# Patient Record
Sex: Male | Born: 1985 | Hispanic: Yes | Marital: Married | Smoking: Never smoker
Health system: Southern US, Community
[De-identification: ages and names within clinical notes are randomized; demographics above are authoritative.]

---

## 2013-07-01 ENCOUNTER — Ambulatory Visit: Payer: Self-pay | Attending: Internal Medicine

## 2013-07-22 ENCOUNTER — Ambulatory Visit: Payer: No Typology Code available for payment source | Attending: Family Medicine | Admitting: Family Medicine

## 2013-07-22 ENCOUNTER — Encounter: Payer: Self-pay | Admitting: Family Medicine

## 2013-07-22 ENCOUNTER — Ambulatory Visit: Payer: No Typology Code available for payment source | Attending: Internal Medicine

## 2013-07-22 VITALS — BP 143/89 | HR 74 | Temp 98.3°F | Resp 16 | Ht 64.0 in | Wt 148.0 lb

## 2013-07-22 DIAGNOSIS — N509 Disorder of male genital organs, unspecified: Secondary | ICD-10-CM

## 2013-07-22 DIAGNOSIS — N50812 Left testicular pain: Secondary | ICD-10-CM | POA: Insufficient documentation

## 2013-07-22 DIAGNOSIS — I10 Essential (primary) hypertension: Secondary | ICD-10-CM | POA: Insufficient documentation

## 2013-07-22 LAB — CMP AND LIVER
ALT: 29 U/L (ref 0–53)
AST: 23 U/L (ref 0–37)
Alkaline Phosphatase: 98 U/L (ref 39–117)
BUN: 16 mg/dL (ref 6–23)
Creat: 1.06 mg/dL (ref 0.50–1.35)
Total Bilirubin: 0.5 mg/dL (ref 0.3–1.2)

## 2013-07-22 MED ORDER — HYDROCHLOROTHIAZIDE 12.5 MG PO TABS: 12.5000 mg | ORAL_TABLET | Freq: Every day | ORAL | Status: AC

## 2013-07-22 NOTE — Patient Instructions (Signed)
Hipertensión  (Hypertension)  Cuando el corazón late bombea la sangre a través de las arterias. La fuerza que se origina es la presión arterial. Si la presión es demasiado elevada, se denomina hipertensión. El peligro radica en que puede sufrirla y no saberlo. Hipertensión puede significar que su corazón debe trabajar más intensamente para bombear sangre. Las arterias pueden estar estrechas o rígidas. El trabajo extra aumenta el riesgo de enfermedades cardíacas, ictus y otros problemas.   La presión arterial está formada por dos números: el número mayor sobre el número menor, por ejemplo110/70. Se señala "110/72". Los valores ideales son por debajo de 120 para el número más alto (sistólica) y por debajo de 80 para el más bajo (diastólica). Anote su presión sanguínea hoy.  Debe prestar mucha atención a su presión arterial si sufre alguna otra enfermedad como:  · Insuficiencia cardíaca  · Ataques cardiacos previos  · Diabetes  · Enfermedad renal crónica  · Ictus previo  · Múltiples factores de riesgo para enfermedades cardíacas  Para diagnosticar si usted sufre hipertensión arterial, debe medirse la presión mientras encuentra sentado con el brazo elevado a la altura del nivel del corazón. Debe medirse al menos 2 veces. Una única lectura de presión arterial elevada (especialmente en el servicio de emergencias) no significa que necesita tratamiento. Hay enfermedades en las que la presión arterial es diferente en ambos brazos. Es importante que consulte rápidamente con su médico para un control.  La mayoría de las personas sufren hipertensión esencial, lo que significa que no tiene una causa específica. Este tipo de hipertensión puede bajarse modificando algunos factores en el estilo de vida como:  · Estrés.  · El consumo de cigarrillos.  · La falta de actividad física.  · Peso excesivo  · Consumo de drogas y alcohol.  · Consumiendo menos sal  La mayoría de las personas no tienen síntomas hasta que la hipertensión  ocasiona un daño en el organismo. El tratamiento efectivo puede evitar, demorar o reducir ese daño.  TRATAMIENTO:  El tratamiento para la hipertensión, cuando se ha identificado una causa, está dirigido a la misma Hay un gran número en medicamentos para tratarla. Se agrupan en diferentes categorías y el médico seleccionará los medicamentos indicados para usted. Muchos medicamentos disponibles tienen efectos secundarios. Debe comentar los efectos secundarios con su médico.  Si la presión arterial permanece elevada después de modificar su estilo de vida o comenzar a tomar medicamentos:  · Los medicamentos deben ser reemplazados  · Puede ser necesario evaluar otros problemas.  · Debe estar seguro que comprende las indicaciones, que sabe cómo y cuándo tomar los medicamentos.  · Asegúrese de realizar un control con su médico dentro del tiempo indicado (generalmente dentro de las dos semanas) para volver a evaluar la presión arterial y revisar los medicamentos prescritos.  · Si está tomando más de un medicamento para la presión arterial, asegúrese que sabe cómo y en qué momentos debe tomarlos. Tomar los medicamentos al mismo tiempo puede dar como resultado un gran descenso en la presión arterial.  SOLICITE ATENCIÓN MÉDICA DE INMEDIATO SI PRESENTA:  · Dolor de cabeza intenso, visión borrosa o cambios en la visión, o confusión.  · Debilidad o adormecimientos inusuales o sensación de desmayo.  · Dolor de pecho o abdominal intenso, vómitos o problemas para respirar.  ASEGÚRESE QUE:   · Comprende estas instrucciones.  · Controlará su enfermedad.  · Solicitará ayuda inmediatamente si no mejora o si empeora.  Document Released: 10/24/2005 Document Revised: 01/16/2012  ExitCare®   Patient Information ©2014 [employer].

## 2013-07-22 NOTE — Progress Notes (Signed)
Pt here to establish care  c/o right scrotal pain without swelling x 1 yr. Pt was seen for sx  With neg report Pain worsens with underwear. No medical hx noted

## 2013-07-22 NOTE — Progress Notes (Signed)
Patient ID: Larry Schmidt, male   DOB: July 02, 1986, 27 y.o.   MRN: [id number] Patient Demographics  Larry Schmidt, is a 27 y.o. male  WUJ:[id number]  [id number]  DOB - 1985/12/08  CC:  Chief Complaint  Patient presents with  . Testicle Pain       HPI: Larry Schmidt is a 27 y.o. male here today to establish medical care. He complains of his left testicular pain that has been going on for about 18 months, on and off, occasional swelling but no lump. Usually aggravated by standing for a long time, no known relieving factor. No fever. No history of sexually transmitted infection, no penile discharge at anytime. Patient has seen several doctors, each time he was reassured that there is no pathological finding. No imaging studies done. The most recent doctor's visit revealed that his blood pressure was high, he was started on lisinopril 5 mg tablet by mouth daily, patient claims he has not been taking them because it makes him to have palpitation. He quit taking the lisinopril 2 months ago. He reports occasional headache but no blurry vision. Patient has No chest pain, No abdominal pain - No Nausea, No new weakness tingling or numbness, No Cough - SOB.  No Known Allergies History reviewed. No pertinent past medical history. No current outpatient prescriptions on file prior to visit.   No current facility-administered medications on file prior to visit.   History reviewed. No pertinent family history. History   Social History  . Marital Status: Married    Spouse Name: N/A    Number of Children: N/A  . Years of Education: N/A   Occupational History  . Not on file.   Social History Main Topics  . Smoking status: Not on file  . Smokeless tobacco: Not on file  . Alcohol Use: Not on file  . Drug Use: Not on file  . Sexual Activity: Not on file   Other Topics Concern  . Not on file   Social History Narrative  . No narrative on file    Review of  Systems: Constitutional: Negative for fever, chills, diaphoresis, activity change, appetite change and fatigue. HENT: Negative for ear pain, nosebleeds, congestion, facial swelling, rhinorrhea, neck pain, neck stiffness and ear discharge.  Eyes: Negative for pain, discharge, redness, itching and visual disturbance. Respiratory: Negative for cough, choking, chest tightness, shortness of breath, wheezing and stridor.  Cardiovascular: Negative for chest pain, palpitations and leg swelling. Gastrointestinal: Negative for abdominal distention. Genitourinary: Negative for dysuria, urgency, frequency, hematuria, flank pain, decreased urine volume, difficulty urinating and dyspareunia.  Musculoskeletal: Negative for back pain, joint swelling, arthralgia and gait problem. Neurological: Negative for dizziness, tremors, seizures, syncope, facial asymmetry, speech difficulty, weakness, light-headedness, numbness and headaches.  Hematological: Negative for adenopathy. Does not bruise/bleed easily. Psychiatric/Behavioral: Negative for hallucinations, behavioral problems, confusion, dysphoric mood, decreased concentration and agitation.    Objective:   Filed Vitals:   07/22/13 1623  BP: 143/89  Pulse: 74  Temp: 98.3 F (36.8 C)  Resp: 16    Physical Exam: Constitutional: Patient appears well-developed and well-nourished. No distress. HENT: Normocephalic, atraumatic, External right and left ear normal. Oropharynx is clear and moist.  Eyes: Conjunctivae and EOM are normal. PERRLA, no scleral icterus. Neck: Normal ROM. Neck supple. No JVD. No tracheal deviation. No thyromegaly. CVS: RRR, S1/S2 +, no murmurs, no gallops, no carotid bruit.  Pulmonary: Effort and breath sounds normal, no stridor, rhonchi, wheezes, rales.  Abdominal: Soft. BS +, no distension, tenderness, rebound or  guarding.  Musculoskeletal: Normal range of motion. No edema and no tenderness.  Lymphadenopathy: No lymphadenopathy noted,  cervical, inguinal or axillary Neuro: Alert. Normal reflexes, muscle tone coordination. No cranial nerve deficit. Skin: Skin is warm and dry. No rash noted. Not diaphoretic. No erythema. No pallor. Psychiatric: Normal mood and affect. Behavior, judgment, thought content normal. External genitalia: Normal male external genitalia, no area of tenderness, no mass, both testicles feel normal without any abnormal texture, no penile discharge. The hernia orifices are normal and intact No results found for this basename: WBC, HGB, HCT, MCV, PLT   No results found for this basename: CREATININE, BUN, NA, K, CL, CO2    No results found for this basename: HGBA1C   Lipid Panel  No results found for this basename: chol, trig, hdl, cholhdl, vldl, ldlcalc       Assessment and plan:   Patient Active Problem List   Diagnosis Date Noted  . Testicular pain, left 07/22/2013  . High blood pressure 07/22/2013    Plan: Start hydrochlorothiazide 12.5 mg tablet by mouth daily Patient extensively counseled about blood pressure and its consequences if not controlled We discussed blood pressure goals Low-salt diet Extensive counseling on nutrition and exercise done Ambulatory blood pressure monitoring and log, and bring report to next appointment  Labs: Comprehensive metabolic panel Urinalysis Testicular/scrotal ultrasound ordered   Follow up in 2-4 weeks   The patient was given clear instructions to go to ER or return to medical center if symptoms don't improve, worsen or new problems develop. The patient verbalized understanding. The patient was told to call to get lab results if they haven't heard anything in the next week.   Jeanann Lewandowsky, MD, MHA, FACP [facility] [place], [place] [phone]   07/22/2013, 4:40 PM

## 2013-07-23 LAB — URINALYSIS, COMPLETE
Bilirubin Urine: NEGATIVE
Casts: NONE SEEN
Crystals: NONE SEEN
Glucose, UA: NEGATIVE mg/dL
Leukocytes, UA: NEGATIVE
Protein, ur: NEGATIVE mg/dL
Specific Gravity, Urine: >1.03 — ABNORMAL HIGH (ref 1.005–1.030)
Squamous Epithelial / LPF: NONE SEEN
pH: 5.5 (ref 5.0–8.0)

## 2013-07-29 ENCOUNTER — Ambulatory Visit (HOSPITAL_COMMUNITY)
Admission: RE | Admit: 2013-07-29 | Discharge: 2013-07-29 | Disposition: A | Discharge status: Home / Self Care | Payer: No Typology Code available for payment source | Source: Ambulatory Visit | Attending: Internal Medicine | Admitting: Internal Medicine

## 2013-07-29 DIAGNOSIS — N508 Other specified disorders of male genital organs: Secondary | ICD-10-CM | POA: Insufficient documentation

## 2013-07-29 DIAGNOSIS — N50812 Left testicular pain: Secondary | ICD-10-CM

## 2013-08-21 ENCOUNTER — Ambulatory Visit: Payer: Self-pay

## 2013-08-21 ENCOUNTER — Ambulatory Visit: Payer: No Typology Code available for payment source | Attending: Internal Medicine | Admitting: Internal Medicine

## 2013-08-21 VITALS — BP 146/89 | HR 69 | Temp 98.0°F | Resp 18

## 2013-08-21 DIAGNOSIS — I10 Essential (primary) hypertension: Secondary | ICD-10-CM

## 2013-08-21 DIAGNOSIS — N509 Disorder of male genital organs, unspecified: Secondary | ICD-10-CM | POA: Insufficient documentation

## 2013-08-21 DIAGNOSIS — N50812 Left testicular pain: Secondary | ICD-10-CM

## 2013-08-21 MED ORDER — HYDROCORTISONE 1 % EX OINT: TOPICAL_OINTMENT | Freq: Two times a day (BID) | CUTANEOUS | Status: AC

## 2013-08-21 NOTE — Progress Notes (Signed)
Patient here for follow up for pain in his scrotum

## 2013-08-21 NOTE — Progress Notes (Signed)
Patient ID: Larry Schmidt, male   DOB: 1986/09/26, 27 y.o.   MRN: [id number]     Followup visit  History of present illness 27 year old Hispanic male accompanied by his wife accosted for him. Patient was seen in the clinic 4 weeks back for left testicular pain the. A UA and ultrasound of the scrotum done was unremarkable except for a small right epididymal cyst. Patient reports that he had been over his left testis on a daily basis but for past 2 weeks he has pain  only occasionally about  2 times a week and on exertion. Denies hematuria or pain bleeding after intercourse. Denies any testicular swelling. Denies fever or chills. Denies any bowel symptoms. He is wife brought a log of his blood pressure which showed a normal readings on all occasions.  Vital signs in last 24 hours:  Filed Vitals:   08/21/13 1742  BP: 146/89  Pulse: 69  Temp: 98 F (36.7 C)  Resp: 18  SpO2: 100%      Physical Exam:  General: young male  in no acute distress. HEENT: no pallor, no icterus,  Heart: Normal  s1 &s2   Lungs: Clear to auscultation bilaterally. Abdomen: Soft, nontender, nondistended, positive bowel sounds. Genitalia exam: No scrotal swelling. Normal Palpable testis bilaterally, equal in size, nontender,  Neuro: Alert, awake, oriented x3, nonfocal.   Lab Results:  Basic Metabolic Panel:    Component Value Date/Time   NA 137 07/22/2013 1648   K 4.1 07/22/2013 1648   CL 104 07/22/2013 1648   CO2 26 07/22/2013 1648   BUN 16 07/22/2013 1648   CREATININE 1.06 07/22/2013 1648   GLUCOSE 92 07/22/2013 1648   CALCIUM 9.8 07/22/2013 1648   CBC: No results found for this basename: wbc, hgb, hct, plt, mcv, neutroabs, lymphsabs, monoabs, eosabs, basosabs    No results found for this or any previous visit (from the past 240 hour(s)).  Studies/Results: No results found.  Medications: Scheduled Meds: Continuous Infusions: PRN Meds:.    Assessment/Plan:  Left testicular pain Normal  UA and ultrasound of the scrotum. Symptoms have improved for possible weeks. Normal clinical exam. Will refer to urology if symptoms persist. Followup in 2 weeks if symptoms persist  Hypertension On evaluating log of his blood pressure reading at home it appears stable. Continue current blood pressure medication.  Followup in 2 weeks if symptoms persist otherwise followup in 3 months     Larry Schmidt 08/21/2013, 5:46 PM

## 2013-09-12 ENCOUNTER — Ambulatory Visit: Payer: No Typology Code available for payment source | Attending: Internal Medicine | Admitting: Pharmacist

## 2013-09-12 VITALS — Temp 97.7°F

## 2013-09-12 DIAGNOSIS — Z23 Encounter for immunization: Secondary | ICD-10-CM | POA: Insufficient documentation

## 2013-09-12 NOTE — Progress Notes (Signed)
Patient here for flu vaccine only.

## 2013-11-21 ENCOUNTER — Ambulatory Visit: Payer: No Typology Code available for payment source | Admitting: Internal Medicine

## 2013-12-04 ENCOUNTER — Ambulatory Visit: Payer: No Typology Code available for payment source | Attending: Internal Medicine | Admitting: Internal Medicine

## 2013-12-04 ENCOUNTER — Encounter: Payer: Self-pay | Admitting: Internal Medicine

## 2013-12-04 VITALS — BP 157/99 | HR 66 | Temp 98.1°F | Resp 17 | Wt 153.4 lb

## 2013-12-04 DIAGNOSIS — I10 Essential (primary) hypertension: Secondary | ICD-10-CM | POA: Insufficient documentation

## 2013-12-04 DIAGNOSIS — N50812 Left testicular pain: Secondary | ICD-10-CM

## 2013-12-04 DIAGNOSIS — R0981 Nasal congestion: Secondary | ICD-10-CM

## 2013-12-04 DIAGNOSIS — B351 Tinea unguium: Secondary | ICD-10-CM

## 2013-12-04 DIAGNOSIS — N509 Disorder of male genital organs, unspecified: Secondary | ICD-10-CM | POA: Insufficient documentation

## 2013-12-04 DIAGNOSIS — J3489 Other specified disorders of nose and nasal sinuses: Secondary | ICD-10-CM

## 2013-12-04 DIAGNOSIS — R6889 Other general symptoms and signs: Secondary | ICD-10-CM | POA: Insufficient documentation

## 2013-12-04 MED ORDER — TERBINAFINE HCL 1 % EX CREA: 1.0000 "application " | TOPICAL_CREAM | Freq: Two times a day (BID) | CUTANEOUS | Status: AC

## 2013-12-04 MED ORDER — SALINE SPRAY 0.65 % NA SOLN: 1.0000 | NASAL | Status: AC | PRN

## 2013-12-04 NOTE — Patient Instructions (Signed)
Dieta reducida en sodio, 2 gramos de sodio (2 Gram Low Sodium Diet) Esta dieta restringe la cantidad de sodio a no ms de 2 gramos o 2000 miligramos (mg) por da. La cantidad de sodio se limita para ayudar a disminuir la presin arterial y es importante para la insuficiencia cardaca y los problemas renales y hepticos. Muchos alimentos contienen sodio para mejorar el sabor y algunas veces como conservantes. Por lo tanto, cuando es necesario disminuir la cantidad de sodio en la dieta, es importante saber que productos hay que buscar cuando se eligen alimentos y bebidas. Puede demorar algn tiempo acostumbrarse a realizar cambios dietarios. Aqu se incluye informacin y una gua que lo ayudarn a adaptarse a una dieta reducida en sodio.  CONSEJOS PRCTICOS  No le agregue sal a los alimentos.  Evite los comidas de preparacin rpida que generalmente son elevadas en sodio.  Elija colaciones sin sal.  Compre productos con bajo contenido de sodio, en los que en su etiqueta diga: "bajo contenido de sodio" o "sin agregado de sal".  Verifique las etiquetas de los alimentos para conocer cunto sodio hay en una porcin.  Cuando coma en un restaurante, pida que preparen su comida con menos sal, y sin nada de sal en lo posible. LECTURA DE LAS ETIQUETAS DE LOS ALIMENTOS PARA INFORMARSE ACERCA DEL SODIO QUE CONTIENEN. La seccin Informacin Nutricional es un buen lugar en el que hallar cunta cantidad de sodio contienen los alimentos. Busque productos que no contengan ms de 500-600 mg/comida y no ms de 150 mg. por porcin Recuerde que 2 g = 2000 mg. La etiqueta de los alimentos tambin puede informar de ste modo:  Sin contenido de sodio: Menos de cinco mg. por porcin.  Muy bajo contenido de sodio: 35 mg o menos por porcin.  Bajo contenido de sodio: 140 mg o menos por porcin.  Bajo en sodio: 50% menos de sodio por porcin. Por ejemplo, si un alimento generalmente contiene 300 mg de sodio se  modifica para ser considerado bajo en sodio, tendr 150 mg de sodio.  Reducido en sodio: 25% menos de sodio por porcin. Por ejemplo, si un alimento que generalmente tiene 400 mg de sodio se modifica para ser reducido en sodio, tendr 300 mg de sodio. ELECCIN DE LOS ALIMENTOS Granos  Evite: Galletitas y colaciones saladas. Algunos cereales, incluyendo cereales calientes. Panificados y mezclas para bizcochos. Arroz condimentado o mezclas para pastas.  Elija: Colaciones sin sal. Cereales con bajo contenido de sodio, avena, arroz y trigo inflado, cereal para el desayuno. Pan y bollitos tipo Ingls. Pastas. Carnes  Evite: Saladas, en lata, ahumadas, condimentadas, en escabeche incluyendo pescado y pollo. Tocino, jamn, salchichas, fiambres, panchos, anchoas.  Elija: Atn y salmn enlatado con bajo contenido de sodio. Carne, pollo o pescado frescos o congelados. Lcteos  Evite: Quesos procesados y cremosos. Queso cottage. [place] y leche condensada. Quesos comunes.  Elija: Leche. Queso cottage con bajo contenido de sodio. Yogur. Crema. Quesos con bajo contenido de sodio. Frutas y Vegetales  Evite: Vegetales comunes en lata. Salsa de tomate y pastas en lata. Vegetales en salsas congelados. Aceitunas. Pickles. Salsas. Chucrut.  Elija: Vegetales en lata con bajo contenido de sodio. Salsa de tomate y pastas con bajo contenido de sodio. Vegetales frescos o congelados. Frutas frescas y congeladas. Condimentos  Evite: Salsas en lata y envasadas. Salsa Worcestershire. Salsa trtara. Salsa Barbecue. Salsa de soja. Salsa de carne. Ketchup. Sal de cebolla, de ajo y sal de mesa. Saborizantes y tiernizantes para carne.  Elija:   Hierbas y especias frescas y secas. Variedades de mostaza y ketchup con bajo contenido de sodio. Jugo de limn. Salsa tabasco. Rbanos picantes. EJEMPLO DE PLAN ALIMENTARIO CON 2 GRAMOS DE SODIO Desayuno / Sodio (mg)  1 taza de leche descremada / 143 mg  2 rebanadas de pan  integral tostado / 270 mg  1 cucharada de margarina en tubo saludable para el corazn / 153 mg  1 huevo duro / 139 mg  1 naranja pequea / 0 mg Almuerzo / Sodio (mg)  1 taza de zanahorias crudas / 76 mg   taza de garbanzos / 298 mg  1 taza de leche descremada / 143 mg   taza de uvas negras / 2 mg  1 pan de pita de salvado / 356 mg Cena / Sodio (mg)  1 taza de pasta de salvado / 2 mg  1 taza de jugo de tomates bajo en sodio / 73 mg  3 onzas de carne picada magra / 57 mg  1 ensalada pequea (1 taza de espicanas crudas,  taza de pepinos,  taza de pimientos amarillos) con 1 cucharada de aceite de oliva y 1 cucharada de vinagre de vino / 25 mg Colacin / Sodio (mg)  1 pote de yogur de vainilla descremado / 107 mg  3 crackers de graham / 127 mg Anlisis nutricional  Caloras: 2033  Protenas: 77 g  Hidratos de carbono: 282 g  Grasa: 72 g  Sodio: 1.971 mg Document Released: 04/17/2006 Document Revised: 01/16/2012 ExitCare Patient Information 2014 [employer].  

## 2013-12-04 NOTE — Progress Notes (Signed)
Patient complains of still having testicle pain US of scrotum was negative Denies any swelling Does roof work for a living

## 2013-12-04 NOTE — Progress Notes (Signed)
MRN: [id number] Name: Larry CleaverValentin Schmidt  Sex: male Age: 28 y.o. DOB: 1986/03/28  Allergies: Review of patient's allergies indicates no known allergies.  Chief Complaint  Patient presents with  . Testicle Pain    HPI: Patient is 28 y.o. male who comes today for followup was seen in our office for left testicle pain, his ultrasound was negative his UA was negative patient just was persistent pain denies any fever chills also history of hypertension today's blood pressure is elevated denies any headache dizziness patient has not been compliant with taking his medication, patient also reported to have nasal congestion sometimes he noticed blood from his nose, he also reported to have nail discoloration in his feet he has tried over-the-counter medication without much improvement.  History reviewed. No pertinent past medical history.  History reviewed. No pertinent past surgical history.    Medication List       This list is accurate as of: 12/04/13  3:40 PM.  Always use your most recent med list.               hydrochlorothiazide 12.5 MG tablet  Commonly known as:  HYDRODIURIL  Take 1 tablet (12.5 mg total) by mouth daily.     hydrocortisone 1 % ointment  Apply topically 2 (two) times daily.     sodium chloride 0.65 % Soln nasal spray  Commonly known as:  OCEAN  Place 1 spray into both nostrils as needed for congestion.     terbinafine 1 % cream  Commonly known as:  LAMISIL AT  Apply 1 application topically 2 (two) times daily.        Meds ordered this encounter  Medications  . terbinafine (LAMISIL AT) 1 % cream    Sig: Apply 1 application topically 2 (two) times daily.    Dispense:  30 g    Refill:  0  . sodium chloride (OCEAN) 0.65 % SOLN nasal spray    Sig: Place 1 spray into both nostrils as needed for congestion.    Dispense:  30 mL    Refill:  3    Immunization History  Administered Date(s) Administered  . Influenza,inj,Quad PF,36+ Mos 09/12/2013     History reviewed. No pertinent family history.  History  Substance Use Topics  . Smoking status: Never Smoker   . Smokeless tobacco: Not on file  . Alcohol Use: Not on file    Review of Systems  As noted in HPI  Filed Vitals:   12/04/13 1430  BP: 157/99  Pulse: 66  Temp: 98.1 F (36.7 C)  Resp: 17    Physical Exam  Physical Exam  Constitutional: No distress.  HENT:  Some nasal congestion no sinus tenderness  Eyes: EOM are normal. Pupils are equal, round, and reactive to light.  Cardiovascular: Normal rate and regular rhythm.   Pulmonary/Chest: Breath sounds normal. No respiratory distress. He has no wheezes. He has no rales.  Genitourinary:  No testicular tenderness noted, no penile discharge or any rash  Musculoskeletal:  Yellowish toenail discoloration in both feet    CBC No results found for this basename: wbc,  rbc,  hgb,  hct,  plt,  mcv,  neutrabs,  lymphsabs,  monoabs,  eosabs,  basosabs    CMP     Component Value Date/Time   NA 137 07/22/2013 1648   K 4.1 07/22/2013 1648   CL 104 07/22/2013 1648   CO2 26 07/22/2013 1648   GLUCOSE 92 07/22/2013 1648   BUN 16 07/22/2013 1648  CREATININE 1.06 07/22/2013 1648   CALCIUM 9.8 07/22/2013 1648   PROT 7.7 07/22/2013 1648   ALBUMIN 5.1 07/22/2013 1648   AST 23 07/22/2013 1648   ALT 29 07/22/2013 1648   ALKPHOS 98 07/22/2013 1648   BILITOT 0.5 07/22/2013 1648    No results found for this basename: chol,  tri,  ldl    No components found with this basename: hga1c    Lab Results  Component Value Date/Time   AST 23 07/22/2013  4:48 PM    Assessment and Plan  Essential hypertension, benign Have advised patient to be compliant with his blood pressure medication, also advise for low salt diet  Nasal congestion - Plan: sodium chloride (OCEAN) 0.65 % SOLN nasal spray  Testicular pain, left - Plan: Ambulatory referral to Urology  Onychomycosis - Plan: terbinafine (LAMISIL AT) 1 % cream    Return in about  3 months (around 03/04/2014).  Doris Cheadle, MD

## 2013-12-08 IMAGING — US US SCROTUM
1 series · 14 of 25 positions shown · non-contrast
Comparison: None

CLINICAL DATA: Left testicular pain

EXAM:
ULTRASOUND OF SCROTUM
TECHNIQUE: Complete ultrasound examination of the testicles, epididymis, and
other scrotal structures was performed.

[Series 1: us scrotum · 0.06mm/px · 14 of 51 slices shown]
[im 1/51]
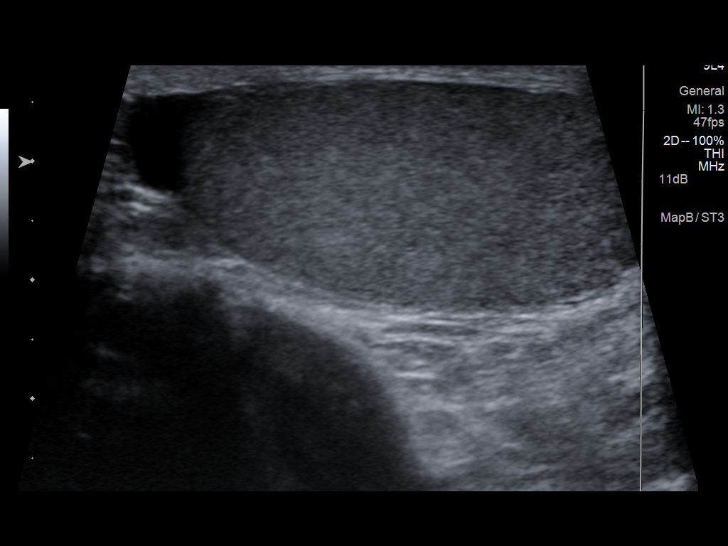
[im 5/51]
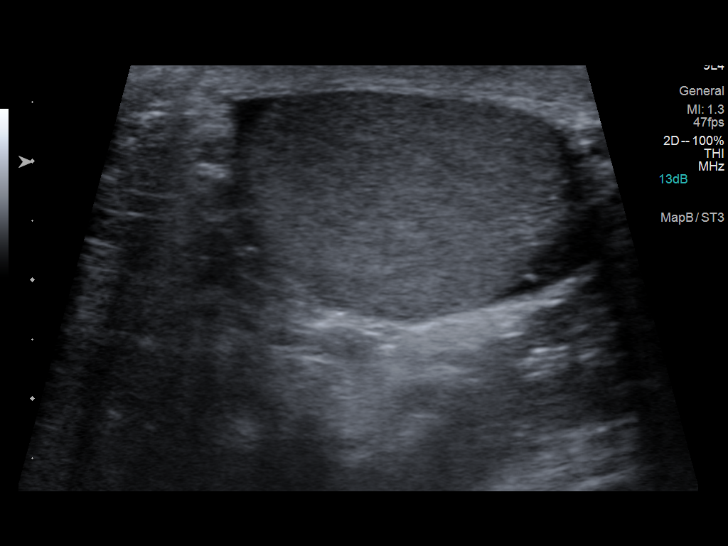
[im 9/51]
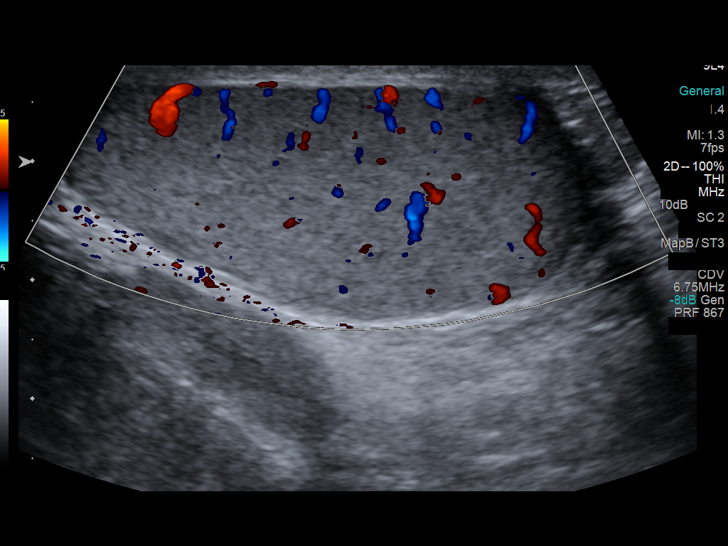
[im 13/51]
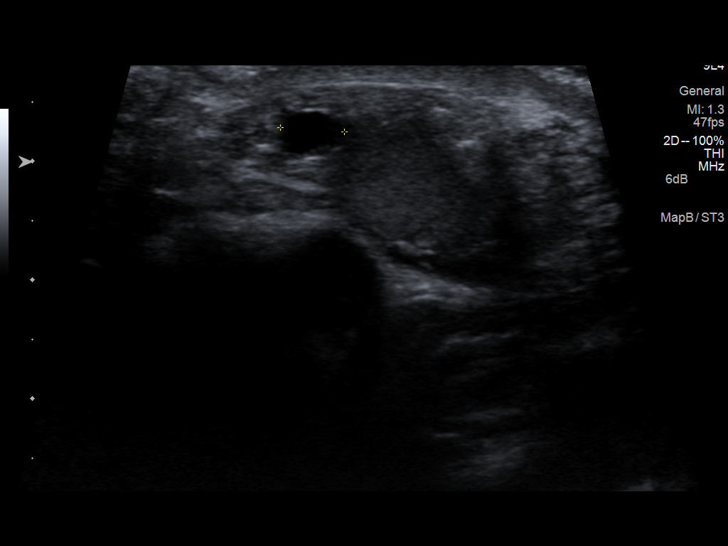
[im 17/51]
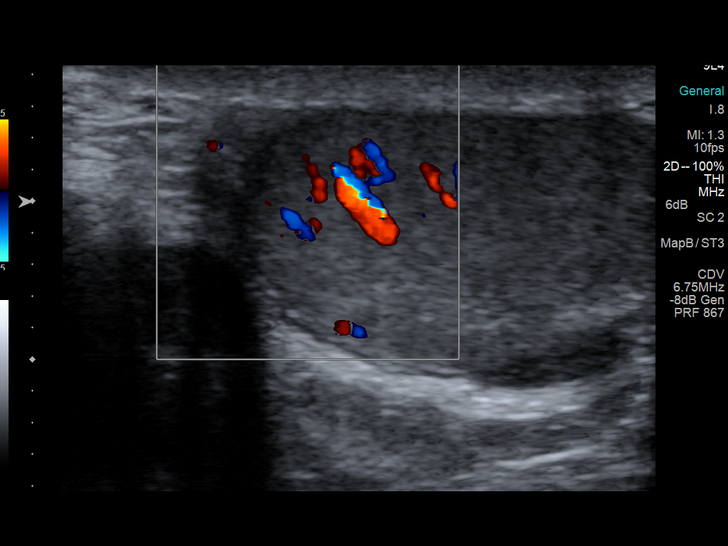
[im 19/51]
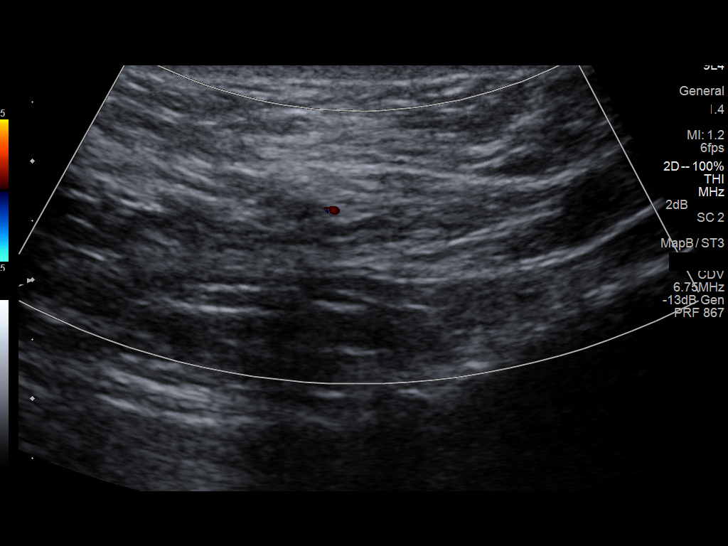
[im 23/51]
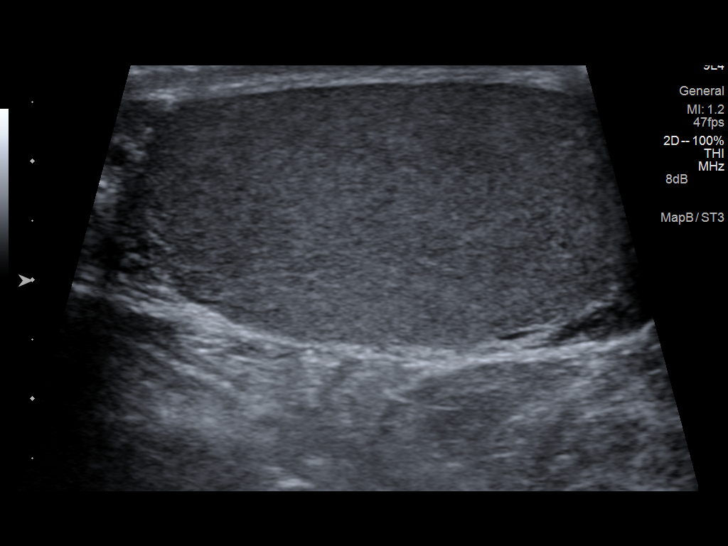
[im 28/51]
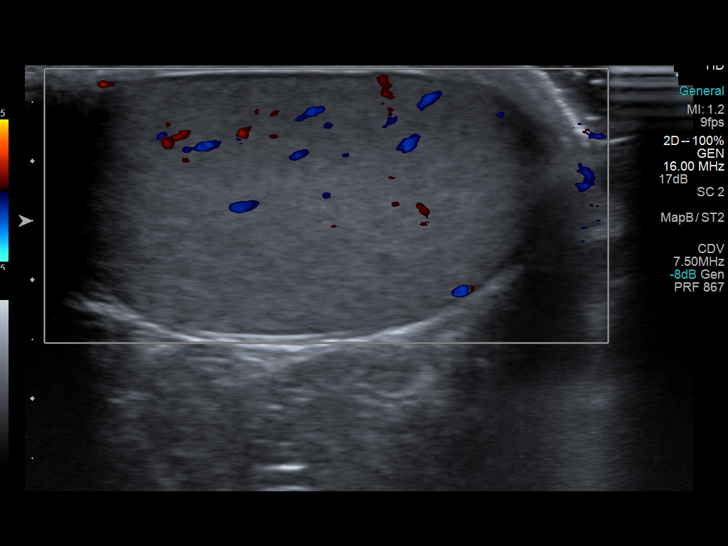
[im 32/51]
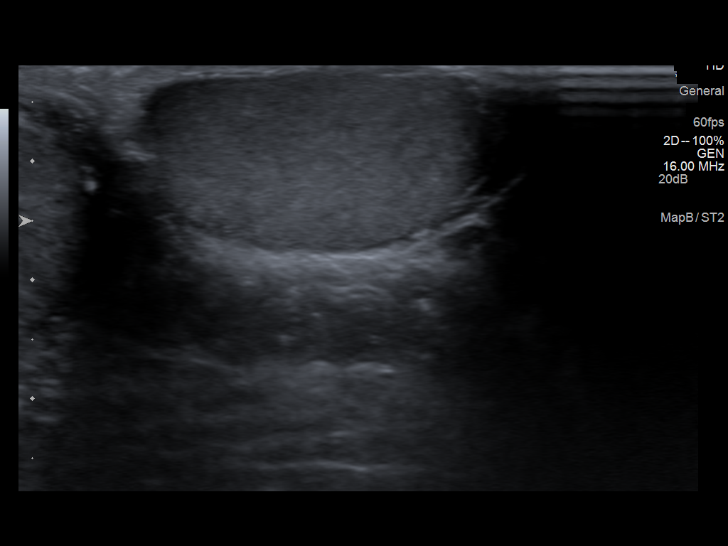
[im 34/51]
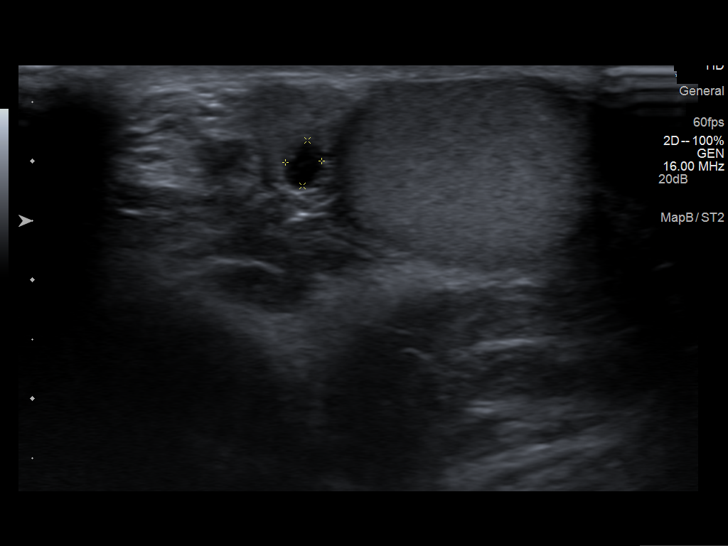
[im 38/51]
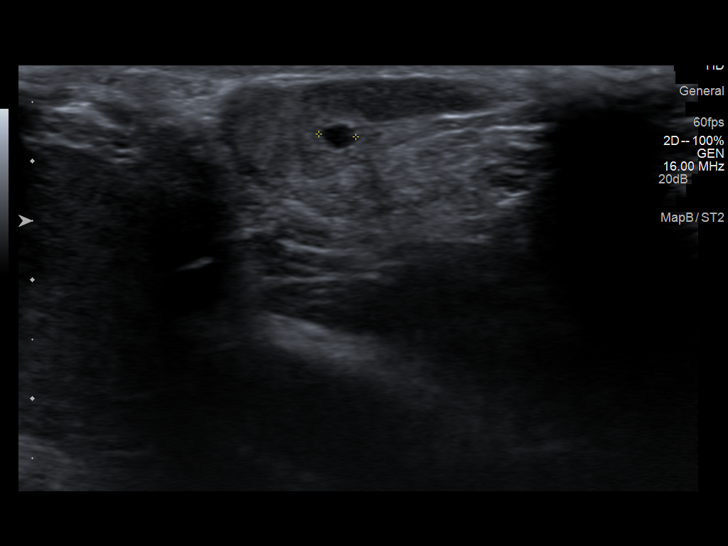
[im 42/51]
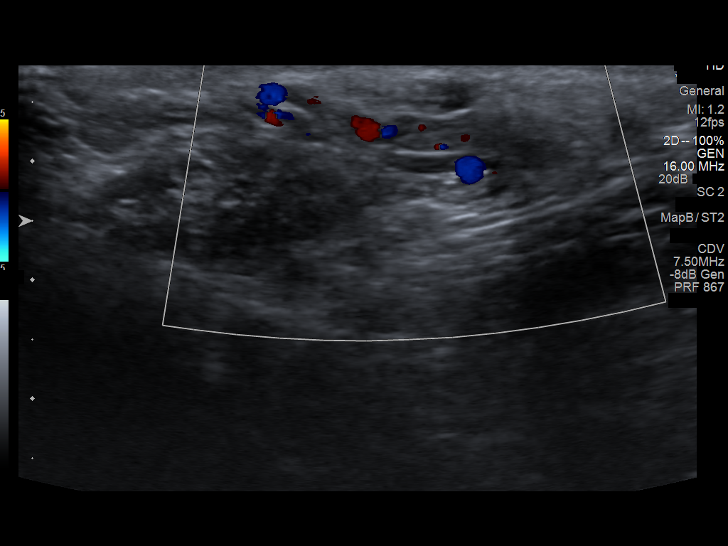
[im 46/51]
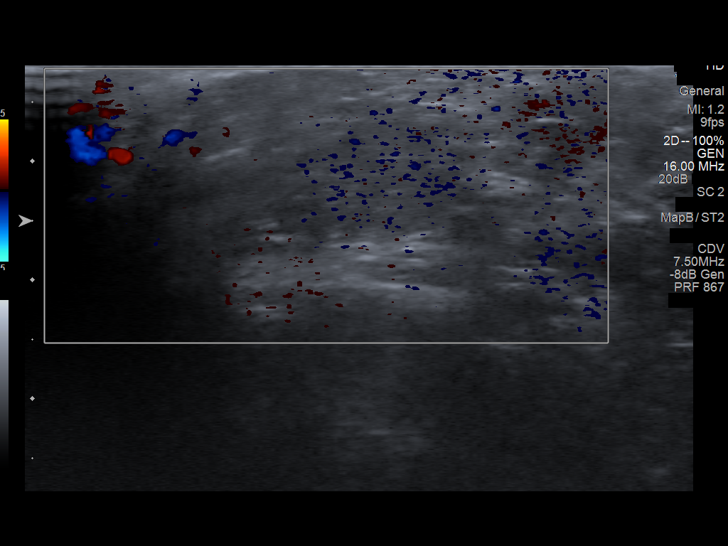
[im 51/51]
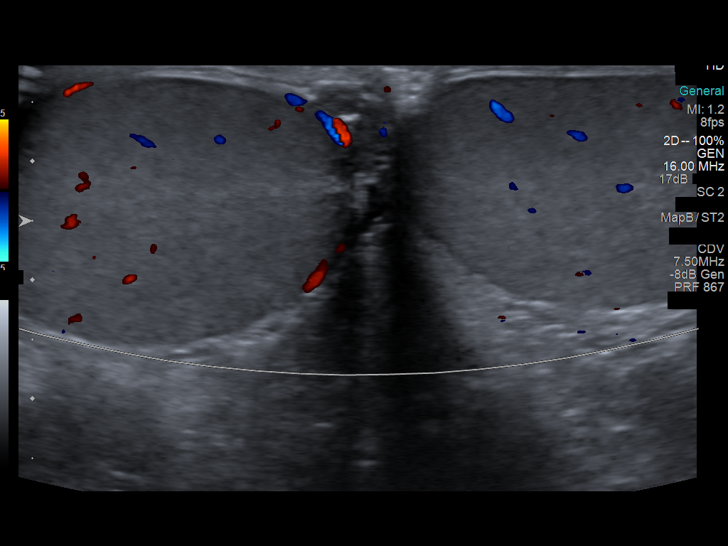

[14 of 25 positions shown; findings below may reference images not displayed]

FINDINGS: Right testicle

Measurements: 4.3 x 1.9 x 3.0 cm. Normal echogenicity without mass
or calcification. Internal blood flow present on color Doppler
imaging.

Left testicle

Measurements: 4.2 x 2.1 x 3.6 cm. Normal echogenicity without mass
or calcification. Internal blood flow present on color Doppler
imaging, symmetric with right.

Right epididymis: Normal in size and echogenicity. Small cyst 4 x 4
x 5 mm at head.

Left epididymis: Normal in size and echogenicity. Small cysts at
head measuring 3 mm and 4 mm in greatest sizes.

Hydrocele:  None visualized.

Varicocele:  None visualized.
IMPRESSION: Small cysts within the epididymi.

Otherwise normal exam.

## 2014-03-04 ENCOUNTER — Ambulatory Visit: Payer: Self-pay

## 2014-03-19 ENCOUNTER — Ambulatory Visit: Payer: Self-pay

## 2014-04-10 ENCOUNTER — Ambulatory Visit: Payer: Self-pay | Attending: Internal Medicine

## 2014-04-17 ENCOUNTER — Ambulatory Visit: Payer: Self-pay | Admitting: Internal Medicine

## 2022-04-04 ENCOUNTER — Other Ambulatory Visit: Payer: Self-pay

## 2022-04-04 ENCOUNTER — Emergency Department (HOSPITAL_COMMUNITY): Payer: Self-pay

## 2022-04-04 ENCOUNTER — Ambulatory Visit (HOSPITAL_COMMUNITY)
Admission: EM | Admit: 2022-04-04 | Discharge: 2022-04-04 | Disposition: A | Discharge status: Home / Self Care | Payer: Self-pay

## 2022-04-04 ENCOUNTER — Emergency Department (HOSPITAL_COMMUNITY)
Admission: EM | Admit: 2022-04-04 | Discharge: 2022-04-04 | Disposition: A | Discharge status: Home / Self Care | Payer: Self-pay | Attending: Emergency Medicine | Admitting: Emergency Medicine

## 2022-04-04 ENCOUNTER — Encounter (HOSPITAL_COMMUNITY): Payer: Self-pay

## 2022-04-04 ENCOUNTER — Encounter (HOSPITAL_COMMUNITY): Payer: Self-pay | Admitting: Emergency Medicine

## 2022-04-04 DIAGNOSIS — R109 Unspecified abdominal pain: Secondary | ICD-10-CM | POA: Insufficient documentation

## 2022-04-04 DIAGNOSIS — R1084 Generalized abdominal pain: Secondary | ICD-10-CM

## 2022-04-04 DIAGNOSIS — R197 Diarrhea, unspecified: Secondary | ICD-10-CM | POA: Insufficient documentation

## 2022-04-04 DIAGNOSIS — R112 Nausea with vomiting, unspecified: Secondary | ICD-10-CM | POA: Insufficient documentation

## 2022-04-04 LAB — URINALYSIS, ROUTINE W REFLEX MICROSCOPIC
Bacteria, UA: NONE SEEN
Bilirubin Urine: NEGATIVE
Glucose, UA: NEGATIVE mg/dL
Hgb urine dipstick: NEGATIVE
Ketones, ur: NEGATIVE mg/dL
Leukocytes,Ua: NEGATIVE
Nitrite: NEGATIVE
Protein, ur: 30 mg/dL — AB
Specific Gravity, Urine: 1.027 (ref 1.005–1.030)
pH: 5 (ref 5.0–8.0)

## 2022-04-04 LAB — CBC
HCT: 49.6 % (ref 39.0–52.0)
Hemoglobin: 17.1 g/dL — ABNORMAL HIGH (ref 13.0–17.0)
MCH: 31.1 pg (ref 26.0–34.0)
MCHC: 34.5 g/dL (ref 30.0–36.0)
MCV: 90.3 fL (ref 80.0–100.0)
Platelets: 157 10*3/uL (ref 150–400)
RBC: 5.49 MIL/uL (ref 4.22–5.81)
RDW: 11.9 % (ref 11.5–15.5)
WBC: 5.9 10*3/uL (ref 4.0–10.5)
nRBC: 0 % (ref 0.0–0.2)

## 2022-04-04 LAB — COMPREHENSIVE METABOLIC PANEL
ALT: 34 U/L (ref 0–44)
AST: 29 U/L (ref 15–41)
Albumin: 4.2 g/dL (ref 3.5–5.0)
Alkaline Phosphatase: 60 U/L (ref 38–126)
Anion gap: 9 (ref 5–15)
BUN: 14 mg/dL (ref 6–20)
CO2: 22 mmol/L (ref 22–32)
Calcium: 9 mg/dL (ref 8.9–10.3)
Chloride: 107 mmol/L (ref 98–111)
Creatinine, Ser: 0.88 mg/dL (ref 0.61–1.24)
GFR, Estimated: >60 mL/min (ref >60)
Glucose, Bld: 105 mg/dL — ABNORMAL HIGH (ref 70–99)
Potassium: 3.5 mmol/L (ref 3.5–5.1)
Sodium: 138 mmol/L (ref 135–145)
Total Bilirubin: 1 mg/dL (ref 0.3–1.2)
Total Protein: 7.4 g/dL (ref 6.5–8.1)

## 2022-04-04 LAB — LIPASE, BLOOD: Lipase: 31 U/L (ref 11–51)

## 2022-04-04 MED ORDER — IOHEXOL 300 MG/ML  SOLN
80.0000 mL | Freq: Once | INTRAMUSCULAR | Status: AC | PRN
  Administered 2022-04-04: 80 mL via INTRAVENOUS

## 2022-04-04 MED ORDER — ONDANSETRON 4 MG PO TBDP
8.0000 mg | ORAL_TABLET | Freq: Once | ORAL | Status: AC
Start: 2022-04-04 — End: 2022-04-04
  Administered 2022-04-04: 8 mg via ORAL
  Filled 2022-04-04: qty 2

## 2022-04-04 MED ORDER — ONDANSETRON HCL 8 MG PO TABS: 8.0000 mg | ORAL_TABLET | Freq: Three times a day (TID) | ORAL | 0 refills | Status: AC | PRN

## 2022-04-04 MED ORDER — ONDANSETRON 4 MG PO TBDP
4.0000 mg | ORAL_TABLET | Freq: Once | ORAL | Status: AC
  Administered 2022-04-04: 4 mg via ORAL
  Filled 2022-04-04: qty 1

## 2022-04-04 NOTE — ED Notes (Signed)
DC instructions reviewed with pt. PT verbalized understanding. Pt DC °

## 2022-04-04 NOTE — ED Provider Triage Note (Signed)
Emergency Medicine Provider Triage Evaluation Note  Larry Schmidt , a 36 y.o. male  was evaluated in triage.  Pt complains of abdominal pain and diarrhea and vomiting for the past 4 days. Reports he had a fever the first day, but none since. Reports 4-5 vomiting episodes per day. Reports possibly one day of black stools. Reports abdominal pain diffusely as well as bilateral flank. Denies any pain in testicles or penis.   Review of Systems  Positive:  Negative:   Physical Exam  BP (!) 148/100 (BP Location: Left Arm)   Pulse 82   Temp 97.7 F (36.5 C) (Oral)   Resp 20   SpO2 100%  Gen:   Awake, no distress   Resp:  Normal effort  MSK:   Moves extremities without difficulty  Other:  Diffuse abdominal tenderness to palpation with some voluntary guarding. Right CVA tenderness. The patient is diaphoretic.   Medical Decision Making  Medically screening exam initiated at 8:40 PM.  Appropriate orders placed.  Chelsey Alix was informed that the remainder of the evaluation will be completed by another provider, this initial triage assessment does not replace that evaluation, and the importance of remaining in the ED until their evaluation is complete.  Will order basic labs as well as CT imaging.    Sherrell Puller, [place] 04/04/22 2045

## 2022-04-04 NOTE — ED Provider Notes (Signed)
[facility]    CSN: [id number] Arrival date & time: 04/04/22  1950     History   Chief Complaint Chief Complaint  Patient presents with   Abdominal Pain   Emesis    HPI Larry Schmidt is a 36 y.o. male.   Presents with abdominal pain and emesis x 4 days. Pain worsened 1 hour ago. Rates 10/10. Multiple episodes of vomiting and diarrhea today.  Patient in clear distress on exam table, bent over and grimacing. I recommend he go to the emergency department to which he agrees.  History reviewed. No pertinent past medical history.  Patient Active Problem List   Diagnosis Date Noted   Testicular pain, left 07/22/2013   High blood pressure 07/22/2013    History reviewed. No pertinent surgical history.   Home Medications    Prior to Admission medications   Medication Sig Start Date End Date Taking? Authorizing Provider  hydrochlorothiazide (HYDRODIURIL) 12.5 MG tablet Take 1 tablet (12.5 mg total) by mouth daily. 07/22/13   Quentin Angst, MD  hydrocortisone 1 % ointment Apply topically 2 (two) times daily. 08/21/13   Dhungel, Nishant, MD  sodium chloride (OCEAN) 0.65 % SOLN nasal spray Place 1 spray into both nostrils as needed for congestion. 12/04/13   Doris Cheadle, MD  terbinafine (LAMISIL AT) 1 % cream Apply 1 application topically 2 (two) times daily. 12/04/13   Doris Cheadle, MD    Family History History reviewed. No pertinent family history.  Social History Social History   Tobacco Use   Smoking status: Never     Allergies   Patient has no known allergies.   Review of Systems Review of Systems  Gastrointestinal:  Positive for abdominal pain and vomiting.   As per HPI  Physical Exam Triage Vital Signs ED Triage Vitals [04/04/22 1956]  Enc Vitals Group     BP (!) 137/96     Pulse Rate 83     Resp 18     Temp 97.9 F (36.6 C)     Temp Source Oral     SpO2 97 %     Weight      Height      Head Circumference      Peak  Flow      Pain Score      Pain Loc      Pain Edu?      Excl. in GC?    No data found.  Updated Vital Signs BP (!) 137/96 (BP Location: Left Arm)   Pulse 83   Temp 97.9 F (36.6 C) (Oral)   Resp 18   SpO2 97%    Physical Exam Vitals and nursing note reviewed.  Constitutional:      General: He is in acute distress.  Pulmonary:     Breath sounds: Normal breath sounds.  Abdominal:     Tenderness: There is abdominal tenderness.  Neurological:     Mental Status: He is alert.    UC Treatments / Results  Labs (all labs ordered are listed, but only abnormal results are displayed) Labs Reviewed - No data to display  EKG  Radiology No results found.  Procedures Procedures (including critical care time)  Medications Ordered in UC Medications - No data to display  Initial Impression / Assessment and Plan / UC Course  I have reviewed the triage vital signs and the nursing notes.  Pertinent labs & imaging results that were available during my care of the patient were reviewed by  me and considered in my medical decision making (see chart for details).  Patient with severe abdominal pain and very uncomfortable in exam room. Doubled over, rates pain 10/10. At this time I believe he needs to go straight to the emergency department for further evaluation and treatment. Patient and wife agree to plan and wife will escort him to ED.   Final Clinical Impressions(s) / UC Diagnoses   Final diagnoses:  None   Discharge Instructions   None    ED Prescriptions   None    PDMP not reviewed this encounter.   Lorelei Heikkila, Ray Church 04/04/22 2011

## 2022-04-04 NOTE — Discharge Instructions (Addendum)
The most likely condition that you have is enteritis.  This is inflammation or infection of the intestinal tract.  There are many causes of this.  You do not have a clear diagnosis for appendicitis at this time.  For now, start with clear liquids then gradually advance your diet to regular foods as tolerated.  This process will likely take 2 or 3 days.  We are prescribing a medication to treat nausea and vomiting.  You can take it 4 times a day as needed.  For diarrhea, use Kaopectate, or Imodium.  These are medications that you can get at the pharmacy.  If you are unable to drink or eat anything, or if you are pain worsens and cannot be controlled by taking Tylenol, return here for further care and treatment.  It would not be unusual to be sick another 4 or 5 days.

## 2022-04-04 NOTE — ED Triage Notes (Signed)
Patient reports pain across his lower abdomen with diarrhea and emesis onset last week , he adds that his family members have been sick as well last week .

## 2022-04-04 NOTE — ED Triage Notes (Signed)
C/o severe abdominal pain and vomiting that started 4 days ago.

## 2022-04-04 NOTE — ED Provider Notes (Signed)
[facility] EMERGENCY DEPARTMENT Provider Note   CSN: [id number] Arrival date & time: 04/04/22  2011     History {Add pertinent medical, surgical, social history, OB history to HPI:1} Chief Complaint  Patient presents with   Abdominal Pain   Emesis    Diarrhea     Larry Schmidt is a 36 y.o. male.  HPI He presents for evaluation abdominal pain with diarrhea and vomiting.  He has been sick for several days.  He went to a urgent care, today who referred him here for further evaluation and treatment.  He reports having vomiting and diarrhea intermittently for 6 days.  No blood in emesis or stool.  No persistent fever.  No cough, shortness of breath, weakness or dizziness    Home Medications Prior to Admission medications   Medication Sig Start Date End Date Taking? Authorizing Provider  hydrochlorothiazide (HYDRODIURIL) 12.5 MG tablet Take 1 tablet (12.5 mg total) by mouth daily. 07/22/13   Quentin Angst, MD  hydrocortisone 1 % ointment Apply topically 2 (two) times daily. 08/21/13   Dhungel, Nishant, MD  sodium chloride (OCEAN) 0.65 % SOLN nasal spray Place 1 spray into both nostrils as needed for congestion. 12/04/13   Doris Cheadle, MD  terbinafine (LAMISIL AT) 1 % cream Apply 1 application topically 2 (two) times daily. 12/04/13   Doris Cheadle, MD      Allergies    Patient has no known allergies.    Review of Systems   Review of Systems  Physical Exam Updated Vital Signs BP (!) 136/93 (BP Location: Left Arm)   Pulse 74   Temp 97.7 F (36.5 C) (Oral)   Resp 20   SpO2 99%  Physical Exam Vitals and nursing note reviewed.  Constitutional:      General: He is not in acute distress.    Appearance: He is well-developed. He is not ill-appearing, toxic-appearing or diaphoretic.  HENT:     Head: Normocephalic and atraumatic.     Right Ear: External ear normal.     Left Ear: External ear normal.  Eyes:     Conjunctiva/sclera: Conjunctivae  normal.     Pupils: Pupils are equal, round, and reactive to light.  Neck:     Trachea: Phonation normal.  Cardiovascular:     Rate and Rhythm: Normal rate.  Pulmonary:     Effort: Pulmonary effort is normal. No respiratory distress.     Breath sounds: No stridor.  Abdominal:     General: There is no distension.     Palpations: Abdomen is soft.     Tenderness: There is no abdominal tenderness.  Musculoskeletal:        General: Normal range of motion.     Cervical back: Normal range of motion and neck supple.  Skin:    General: Skin is warm and dry.  Neurological:     Mental Status: He is alert and oriented to person, place, and time.     Cranial Nerves: No cranial nerve deficit.     Sensory: No sensory deficit.     Motor: No abnormal muscle tone.     Coordination: Coordination normal.  Psychiatric:        Mood and Affect: Mood normal.        Behavior: Behavior normal.        Thought Content: Thought content normal.        Judgment: Judgment normal.    ED Results / Procedures / Treatments   Labs (all  labs ordered are listed, but only abnormal results are displayed) Labs Reviewed  COMPREHENSIVE METABOLIC PANEL - Abnormal; Notable for the following components:      Result Value   Glucose, Bld 105 (*)    All other components within normal limits  CBC - Abnormal; Notable for the following components:   Hemoglobin 17.1 (*)    All other components within normal limits  URINALYSIS, ROUTINE W REFLEX MICROSCOPIC - Abnormal; Notable for the following components:   Protein, ur 30 (*)    All other components within normal limits  LIPASE, BLOOD    EKG None  Radiology No results found.  Procedures Procedures  {Document cardiac monitor, telemetry assessment procedure when appropriate:1}  Medications Ordered in ED Medications  ondansetron (ZOFRAN-ODT) disintegrating tablet 4 mg (4 mg Oral Given 04/04/22 2108)    ED Course/ Medical Decision Making/ A&P                            Medical Decision Making Amount and/or Complexity of Data Reviewed Labs: ordered. Discussion of management or test interpretation with external provider(s): Discussed with general surgeon, Dr. Bedelia Person.  She feels that based on his clinical exam, laboratory findings and clinical history that the patient can be treated symptomatically for now and return if he has uncontrollable pain.   ***  {Document critical care time when appropriate:1} {Document review of labs and clinical decision tools ie heart score, Chads2Vasc2 etc:1}  {Document your independent review of radiology images, and any outside records:1} {Document your discussion with family members, caretakers, and with consultants:1} {Document social determinants of health affecting pt's care:1} {Document your decision making why or why not admission, treatments were needed:1} Final Clinical Impression(s) / ED Diagnoses Final diagnoses:  None    Rx / DC Orders ED Discharge Orders     None

## 2022-05-05 ENCOUNTER — Other Ambulatory Visit: Payer: Self-pay

## 2022-05-05 ENCOUNTER — Emergency Department (HOSPITAL_COMMUNITY)
Admission: EM | Admit: 2022-05-05 | Discharge: 2022-05-05 | Disposition: A | Discharge status: Home / Self Care | Payer: Medicaid Other | Attending: Emergency Medicine | Admitting: Emergency Medicine

## 2022-05-05 ENCOUNTER — Emergency Department (HOSPITAL_COMMUNITY): Payer: Medicaid Other

## 2022-05-05 ENCOUNTER — Encounter (HOSPITAL_COMMUNITY): Payer: Self-pay | Admitting: Emergency Medicine

## 2022-05-05 DIAGNOSIS — R110 Nausea: Secondary | ICD-10-CM | POA: Diagnosis not present

## 2022-05-05 DIAGNOSIS — Z79899 Other long term (current) drug therapy: Secondary | ICD-10-CM | POA: Insufficient documentation

## 2022-05-05 DIAGNOSIS — R1013 Epigastric pain: Secondary | ICD-10-CM | POA: Insufficient documentation

## 2022-05-05 LAB — URINALYSIS, ROUTINE W REFLEX MICROSCOPIC
Bacteria, UA: NONE SEEN
Bilirubin Urine: NEGATIVE
Glucose, UA: NEGATIVE mg/dL
Hgb urine dipstick: NEGATIVE
Ketones, ur: 20 mg/dL — AB
Leukocytes,Ua: NEGATIVE
Nitrite: NEGATIVE
Protein, ur: 100 mg/dL — AB
Specific Gravity, Urine: 1.035 — ABNORMAL HIGH (ref 1.005–1.030)
pH: 5 (ref 5.0–8.0)

## 2022-05-05 LAB — COMPREHENSIVE METABOLIC PANEL
ALT: 30 U/L (ref 0–44)
AST: 25 U/L (ref 15–41)
Albumin: 4.4 g/dL (ref 3.5–5.0)
Alkaline Phosphatase: 66 U/L (ref 38–126)
Anion gap: 10 (ref 5–15)
BUN: 15 mg/dL (ref 6–20)
CO2: 24 mmol/L (ref 22–32)
Calcium: 9.8 mg/dL (ref 8.9–10.3)
Chloride: 106 mmol/L (ref 98–111)
Creatinine, Ser: 1 mg/dL (ref 0.61–1.24)
GFR, Estimated: >60 mL/min (ref >60)
Glucose, Bld: 125 mg/dL — ABNORMAL HIGH (ref 70–99)
Potassium: 3.2 mmol/L — ABNORMAL LOW (ref 3.5–5.1)
Sodium: 140 mmol/L (ref 135–145)
Total Bilirubin: 1.1 mg/dL (ref 0.3–1.2)
Total Protein: 6.8 g/dL (ref 6.5–8.1)

## 2022-05-05 LAB — LIPASE, BLOOD: Lipase: 35 U/L (ref 11–51)

## 2022-05-05 LAB — CBC
HCT: 41.7 % (ref 39.0–52.0)
Hemoglobin: 14.9 g/dL (ref 13.0–17.0)
MCH: 31.7 pg (ref 26.0–34.0)
MCHC: 35.7 g/dL (ref 30.0–36.0)
MCV: 88.7 fL (ref 80.0–100.0)
Platelets: 149 10*3/uL — ABNORMAL LOW (ref 150–400)
RBC: 4.7 MIL/uL (ref 4.22–5.81)
RDW: 11.9 % (ref 11.5–15.5)
WBC: 6.2 10*3/uL (ref 4.0–10.5)
nRBC: 0 % (ref 0.0–0.2)

## 2022-05-05 MED ORDER — LIDOCAINE VISCOUS HCL 2 % MT SOLN
15.0000 mL | Freq: Once | OROMUCOSAL | Status: AC
  Administered 2022-05-05: 15 mL via ORAL
  Filled 2022-05-05: qty 15

## 2022-05-05 MED ORDER — OXYCODONE-ACETAMINOPHEN 5-325 MG PO TABS
1.0000 | ORAL_TABLET | Freq: Once | ORAL | Status: AC
  Administered 2022-05-05: 1 via ORAL
  Filled 2022-05-05: qty 1

## 2022-05-05 MED ORDER — MORPHINE SULFATE (PF) 4 MG/ML IV SOLN
4.0000 mg | Freq: Once | INTRAVENOUS | Status: AC
  Administered 2022-05-05: 4 mg via INTRAVENOUS
  Filled 2022-05-05: qty 1

## 2022-05-05 MED ORDER — HYDROCODONE-ACETAMINOPHEN 5-325 MG PO TABS: 1.0000 | ORAL_TABLET | Freq: Four times a day (QID) | ORAL | 0 refills | Status: DC | PRN

## 2022-05-05 MED ORDER — FAMOTIDINE IN NACL 20-0.9 MG/50ML-% IV SOLN
20.0000 mg | Freq: Once | INTRAVENOUS | Status: AC
  Administered 2022-05-05: 20 mg via INTRAVENOUS
  Filled 2022-05-05: qty 50

## 2022-05-05 MED ORDER — ONDANSETRON HCL 4 MG PO TABS
4.0000 mg | ORAL_TABLET | Freq: Three times a day (TID) | ORAL | 0 refills | Status: AC | PRN
  Filled 2022-05-05: qty 10, 4d supply, fill #0

## 2022-05-05 MED ORDER — FENTANYL CITRATE PF 50 MCG/ML IJ SOSY
50.0000 ug | PREFILLED_SYRINGE | Freq: Once | INTRAMUSCULAR | Status: AC
  Administered 2022-05-05: 50 ug via INTRAVENOUS
  Filled 2022-05-05: qty 1

## 2022-05-05 MED ORDER — ONDANSETRON 4 MG PO TBDP
4.0000 mg | ORAL_TABLET | Freq: Once | ORAL | Status: AC
  Administered 2022-05-05: 4 mg via ORAL
  Filled 2022-05-05: qty 1

## 2022-05-05 MED ORDER — ALUM & MAG HYDROXIDE-SIMETH 200-200-20 MG/5ML PO SUSP
30.0000 mL | Freq: Once | ORAL | Status: AC
  Administered 2022-05-05: 30 mL via ORAL
  Filled 2022-05-05: qty 30

## 2022-05-05 MED ORDER — HYDROCODONE-ACETAMINOPHEN 5-325 MG PO TABS
1.0000 | ORAL_TABLET | Freq: Four times a day (QID) | ORAL | 0 refills | Status: AC | PRN
Start: 2022-05-05 — End: ?
  Filled 2022-05-05: qty 6, 2d supply, fill #0

## 2022-05-05 NOTE — ED Triage Notes (Signed)
Patient reports pain across his lower abdomen radiating to left flank with emesis this evening , denies hematuria or fever .

## 2022-05-05 NOTE — ED Notes (Signed)
Patient transported to CT 

## 2022-05-05 NOTE — ED Provider Notes (Signed)
[facility] EMERGENCY DEPARTMENT Provider Note   CSN: [id number] Arrival date & time: 05/05/22  0023     History  Chief Complaint  Patient presents with   Abdominal Pain /Flank Pain     Larry Schmidt is a 36 y.o. male who presents emergency department with a chief complaint of 6 back and stomach pain.  Patient is Spanish-speaking and professional translation services are utilized.  Additional history is gathered by the patient's mother at bedside.  Patient states that he was just getting off work around 6 PM when he began having pain in his left flank that then went to his right flank and then radiated around to his stomach in the distribution of his diaphragm.  Since that time he has had constant, severe, waxing and waning, aching abdominal pain.  He has never had anything like this before.  He has had nausea without vomiting.  He denies diarrhea or constipation.  He denies a history of abdominal surgeries, injuries, cough, chest pain or shortness of breath.  Nothing makes his pain worse or better HPI     Home Medications Prior to Admission medications   Medication Sig Start Date End Date Taking? Authorizing Provider  ondansetron (ZOFRAN) 4 MG tablet Take 1 tablet (4 mg total) by mouth every 8 (eight) hours as needed for nausea or vomiting. 05/05/22  Yes Zaylin Runco, PA-C  hydrochlorothiazide (HYDRODIURIL) 12.5 MG tablet Take 1 tablet (12.5 mg total) by mouth daily. 07/22/13   Quentin Angst, MD  HYDROcodone-acetaminophen (NORCO) 5-325 MG tablet Take 1 tablet by mouth every 6 (six) hours as needed for severe pain. 05/05/22   Arthor Captain, PA-C  hydrocortisone 1 % ointment Apply topically 2 (two) times daily. 08/21/13   Dhungel, Theda Belfast, MD  ondansetron (ZOFRAN) 8 MG tablet Take 1 tablet (8 mg total) by mouth every 8 (eight) hours as needed for nausea or vomiting. 04/04/22   Mancel Bale, MD  sodium chloride (OCEAN) 0.65 % SOLN nasal spray Place 1  spray into both nostrils as needed for congestion. 12/04/13   Doris Cheadle, MD  terbinafine (LAMISIL AT) 1 % cream Apply 1 application topically 2 (two) times daily. 12/04/13   Doris Cheadle, MD      Allergies    Patient has no known allergies.    Review of Systems   Review of Systems  Physical Exam Updated Vital Signs BP 129/75   Pulse 61   Temp 98.1 F (36.7 C) (Oral)   Resp (!) 22   SpO2 99%  Physical Exam Vitals and nursing note reviewed.  Constitutional:      General: He is not in acute distress.    Appearance: He is well-developed. He is not diaphoretic.  HENT:     Head: Normocephalic and atraumatic.  Eyes:     General: No scleral icterus.    Conjunctiva/sclera: Conjunctivae normal.  Cardiovascular:     Rate and Rhythm: Normal rate and regular rhythm.     Heart sounds: Normal heart sounds.  Pulmonary:     Effort: Pulmonary effort is normal. No respiratory distress.     Breath sounds: Normal breath sounds.  Abdominal:     Palpations: Abdomen is soft.     Tenderness: There is no abdominal tenderness.  Musculoskeletal:     Cervical back: Normal range of motion and neck supple.  Skin:    General: Skin is warm and dry.  Neurological:     Mental Status: He is alert.  Psychiatric:  Behavior: Behavior normal.     ED Results / Procedures / Treatments   Labs (all labs ordered are listed, but only abnormal results are displayed) Labs Reviewed  COMPREHENSIVE METABOLIC PANEL - Abnormal; Notable for the following components:      Result Value   Potassium 3.2 (*)    Glucose, Bld 125 (*)    All other components within normal limits  CBC - Abnormal; Notable for the following components:   Platelets 149 (*)    All other components within normal limits  URINALYSIS, ROUTINE W REFLEX MICROSCOPIC - Abnormal; Notable for the following components:   Specific Gravity, Urine 1.035 (*)    Ketones, ur 20 (*)    Protein, ur 100 (*)    All other components within normal  limits  LIPASE, BLOOD    EKG None  Radiology CT Renal Stone Study  Result Date: 05/05/2022 CLINICAL DATA:  36 year old male with pain across the lower abdomen and left flank with vomiting. EXAM: CT ABDOMEN AND PELVIS WITHOUT CONTRAST TECHNIQUE: Multidetector CT imaging of the abdomen and pelvis was performed following the standard protocol without IV contrast. RADIATION DOSE REDUCTION: This exam was performed according to the departmental dose-optimization program which includes automated exposure control, adjustment of the mA and/or kV according to patient size and/or use of iterative reconstruction technique. COMPARISON:  CT Abdomen and Pelvis 04/04/2022. FINDINGS: Lower chest: Negative; minimal dependent atelectasis. Hepatobiliary: Negative noncontrast liver and gallbladder. Pancreas: Negative. Spleen: Negative. Adrenals/Urinary Tract: Normal adrenal glands. Solitary left kidney redemonstrated. Punctate left nephrolithiasis (series 6, image 57) with no left hydronephrosis, no hydroureter, no convincing pararenal inflammation. The left ureter appears negative to the bladder. Occasional pelvic phleboliths are stable. The bladder is completely decompressed. Stomach/Bowel: Decompressed large bowel from the hepatic flexure distally. Right colon is within normal limits. Elongated and somewhat enlarged appendix redemonstrated with multiple appendicoliths, the largest on series 3, image 56 is 8-9 mm diameter and the appendix measures up to 11 mm distal to that stone. However, similar to last month there is no convincing appendiceal inflammation. The tip of the appendix is on series 6, image 35. Proximal to the large appendicoliths the appendix caliber is normal. No dilated or abnormal small bowel. Stomach is relatively decompressed. No free air or free fluid. Vascular/Lymphatic: Normal caliber abdominal aorta. No calcified atherosclerosis or lymphadenopathy identified. Reproductive: Negative. Other: No pelvic  free fluid. Musculoskeletal: Negative. IMPRESSION: 1. 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 2. 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 But no evidence of acute appendicitis. And normal appearance of the appendix proximal to the large lith. 3. Otherwise negative noncontrast CT Abdomen and Pelvis. Electronically Signed   By: Genevie Ann M.D.   On: 05/05/2022 04:48    Procedures Procedures    Medications Ordered in ED Medications  oxyCODONE-acetaminophen (PERCOCET/ROXICET) 5-325 MG per tablet 1 tablet (1 tablet Oral Given 05/05/22 0129)  fentaNYL (SUBLIMAZE) injection 50 mcg (50 mcg Intravenous Given 05/05/22 0459)  ondansetron (ZOFRAN-ODT) disintegrating tablet 4 mg (4 mg Oral Given 05/05/22 0459)  famotidine (PEPCID) IVPB 20 mg premix (0 mg Intravenous Stopped 05/05/22 0620)  alum & mag hydroxide-simeth (MAALOX/MYLANTA) 200-200-20 MG/5ML suspension 30 mL (30 mLs Oral Given 05/05/22 0523)    And  lidocaine (XYLOCAINE) 2 % viscous mouth solution 15 mL (15 mLs Oral Given 05/05/22 0523)  morphine (PF) 4 MG/ML injection 4 mg (4 mg  Intravenous Given 05/05/22 0636)    ED Course/ Medical Decision Making/ A&P Clinical Course as of 05/06/22 0534  Thu May 05, 2022  0527 Urinalysis, Routine w reflex microscopic Urine, Clean Catch(!) [AH]  0527 CBC(!) [AH]  9937 Comprehensive metabolic panel(!) [AH]  0527 Potassium(!): 3.2 [AH]  0527 Glucose(!): 125 [AH]  0527 CT Renal [employer] reviewed, I also reviewed CT imaging and interpreted the images.  No acute findings.  Labs are reassuring.  No elevated white blood cell count.  Mildly elevated blood glucose of insignificant value. [AH]    Clinical Course User Index [AH] Arthor Captain, PA-C                           Medical Decision Making 36 year old male here with abdominal pain and flank pain. The  differential diagnosis for generalized abdominal pain includes, but is not limited to AAA, gastroenteritis, appendicitis, Bowel obstruction, Bowel perforation. Gastroparesis, DKA, Hernia, Inflammatory bowel disease, mesenteric ischemia, pancreatitis, peritonitis SBP, volvulus.  After review of all data points patient does not appear to have biliary colic, pancreatitis, acute appendicitis, colitis, enteritis. Pain him significantly improved after treatment with pain medications and GI cocktail.  Patient had mildly elevated blood glucose likely acute phase reaction.  No other significant findings.  Amount and/or Complexity of Data Reviewed Labs: ordered. Decision-making details documented in ED Course. Radiology: ordered and independent interpretation performed. Decision-making details documented in ED Course.    Details: I ordered and independently interpreted as well as visualized a CT abdomen and pelvis without contrast, renal stone study.  Patient has punctate nephrolithiasis without ureterolithiasis, single kidney, enlarged appendix is unchanged from previous without evidence of acute appendicitis  Risk OTC drugs. Prescription drug management.     Final Clinical Impression(s) / ED Diagnoses Final diagnoses:  None    Rx / DC Orders ED Discharge Orders          Ordered    HYDROcodone-acetaminophen (NORCO) 5-325 MG tablet  Every 6 hours PRN,   Status:  Discontinued        05/05/22 0702    ondansetron (ZOFRAN) 4 MG tablet  Every 8 hours PRN        05/05/22 0702    HYDROcodone-acetaminophen (NORCO) 5-325 MG tablet  Every 6 hours PRN        05/05/22 0706              Arthor Captain, PA-C 05/06/22 0535    Mesner, Barbara Cower, MD 05/11/22 2302

## 2022-05-05 NOTE — Discharge Instructions (Addendum)
El dolor abdominal (vientre) puede ser causado por muchas cosas. Su mdico realiz un examen y posiblemente orden anlisis de sangre/orina e imgenes (tomografa computarizada, radiografas, ultrasonido). Muchos casos pueden observarse y tratarse en el hogar despus de una evaluacin inicial en el departamento de emergencias. Aunque lo den de alta a casa, el dolor abdominal puede ser impredecible. Por lo tanto, necesita un examen repetido si su dolor no se resuelve, regresa o empeora. La mayora de los pacientes con dolor abdominal no necesitan hospitalizacin ni [place], [employer] graves, como la apendicitis y los ataques de vescula biliar, pueden comenzar como un dolor inespecfico. Muchas afecciones abdominales no se pueden diagnosticar en una sola visita, por lo que las evaluaciones de seguimiento son Engineer, production. BUSQUE ATENCIN MDICA INMEDIATA SI: El dolor no desaparece o se vuelve severo. Se desarrolla una temperatura superior a 101. Se producen vmitos repetidos (episodios mltiples). El dolor se localiza en porciones del abdomen. El lado derecho posiblemente podra ser apendicitis. En un adulto, la parte inferior izquierda del abdomen podra ser colitis o diverticulitis. Se expulsa sangre en las heces o vmitos (heces alquitranadas de color rojo brillante o negro). Regrese tambin si presenta dolor en el pecho, dificultad para respirar, [employer], o se siente confuso, no responde bien o no se puede consolar (nios pequeos).

## 2022-05-05 NOTE — ED Notes (Signed)
Patient verbalizes understanding of d/c instructions. Opportunities for questions and answers were provided. Pt d/c from ED and ambulated to lobby where wife is picking pt up. ?

## 2022-05-12 ENCOUNTER — Other Ambulatory Visit: Payer: Self-pay

## 2022-05-25 ENCOUNTER — Other Ambulatory Visit: Payer: Self-pay

## 2022-08-14 IMAGING — CT CT ABD-PELV W/ CM
2 of 4 series · 16 of 46 positions shown, 18 images · IV contrast (APPLIED)
Comparison: None Available.

CLINICAL DATA: Acute abdominal pain with nausea and vomiting.

EXAM:
CT ABDOMEN AND PELVIS WITH CONTRAST
TECHNIQUE: Multidetector CT imaging of the abdomen and pelvis was performed
using the standard protocol following bolus administration of
intravenous contrast.

[Series 3: abdomen 5.0 · axial · 0.76mm/px · z∈[-828,-443]mm · 13 of 85 slices shown, 15 images]
[im 4/85  soft-tissue]
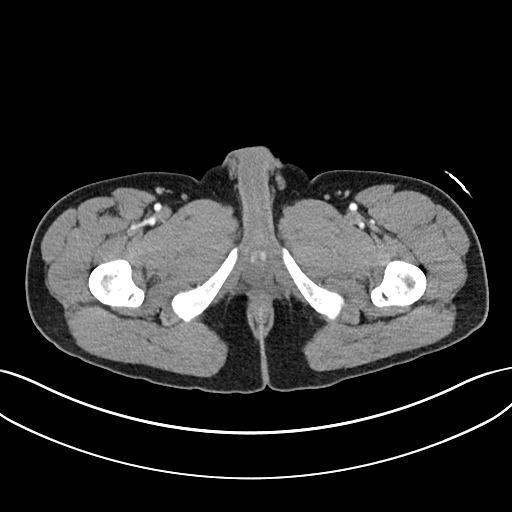
[im 4/85  bone]
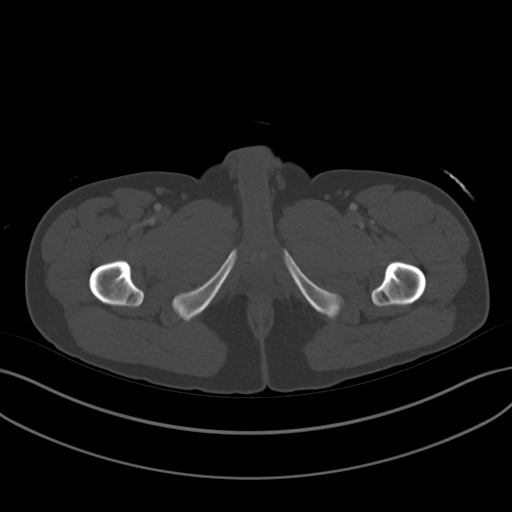
[im 12/85  soft-tissue]
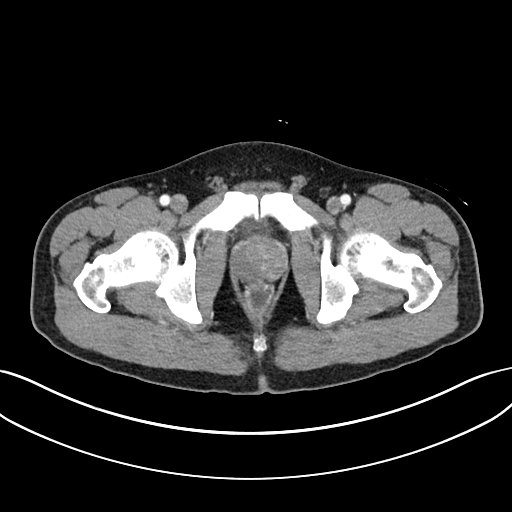
[im 20/85  soft-tissue]
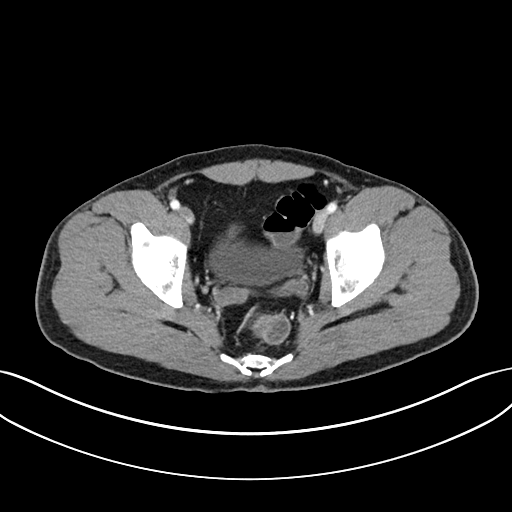
[im 23/85  soft-tissue]
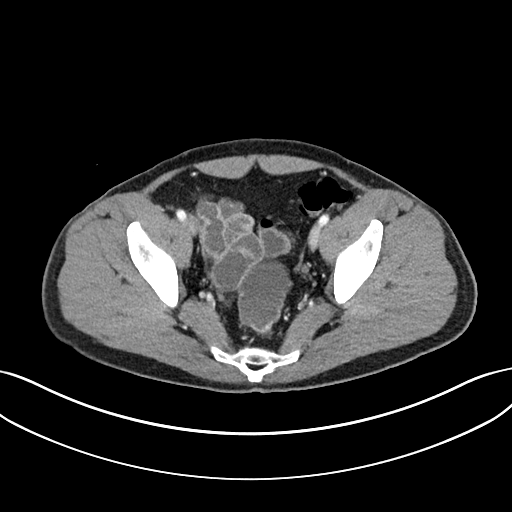
[im 31/85  soft-tissue]
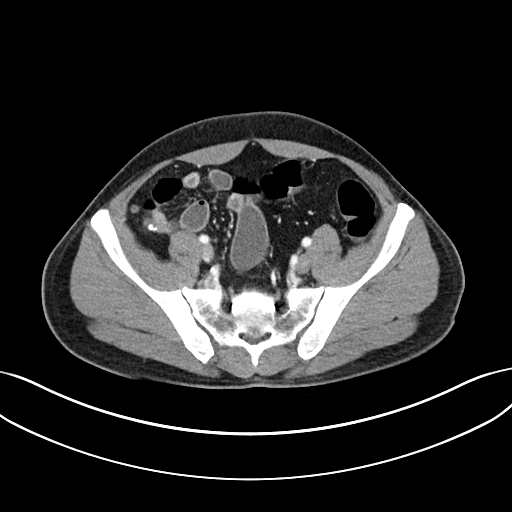
[im 35/85  soft-tissue]
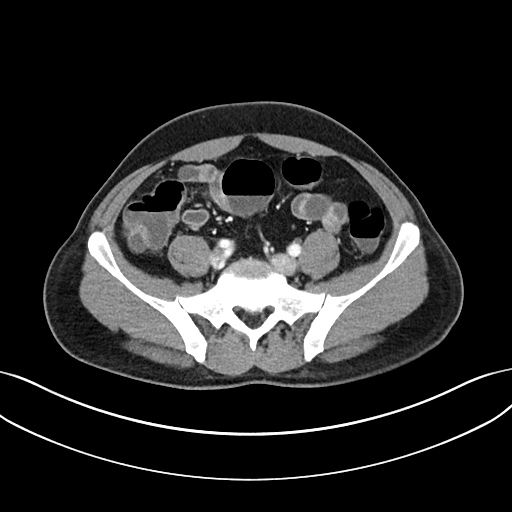
[im 43/85  soft-tissue]
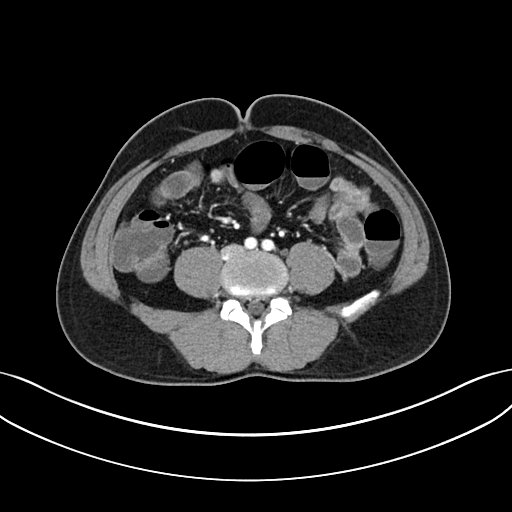
[im 50/85  soft-tissue]
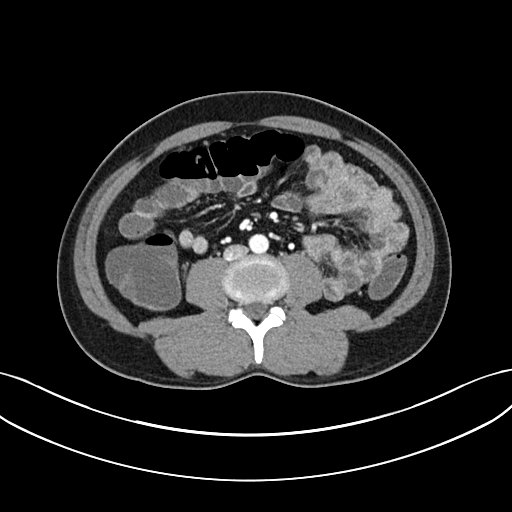
[im 54/85  soft-tissue]
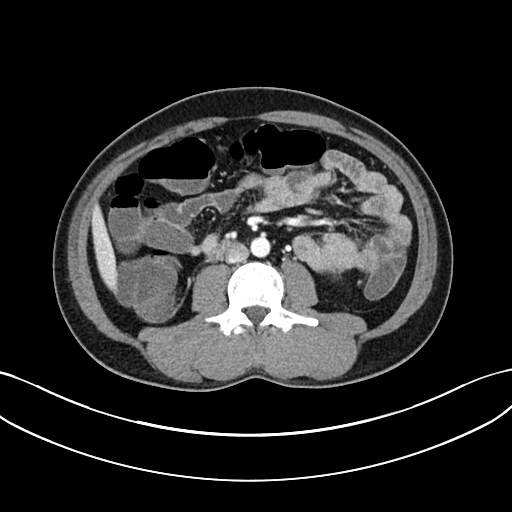
[im 54/85  bone]
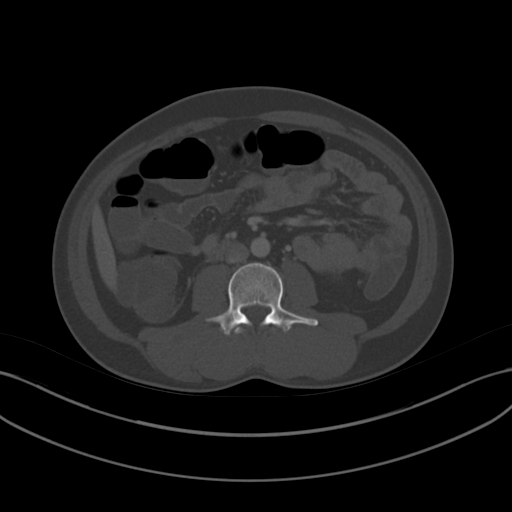
[im 62/85  soft-tissue]
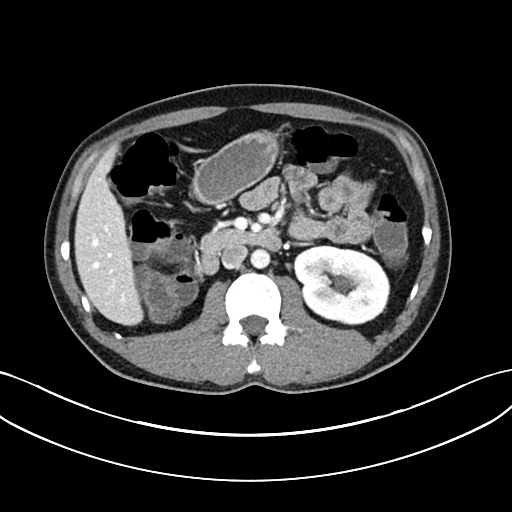
[im 65/85  soft-tissue]
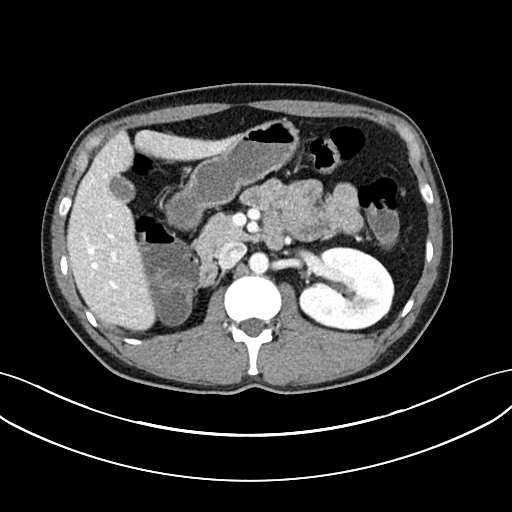
[im 73/85  soft-tissue]
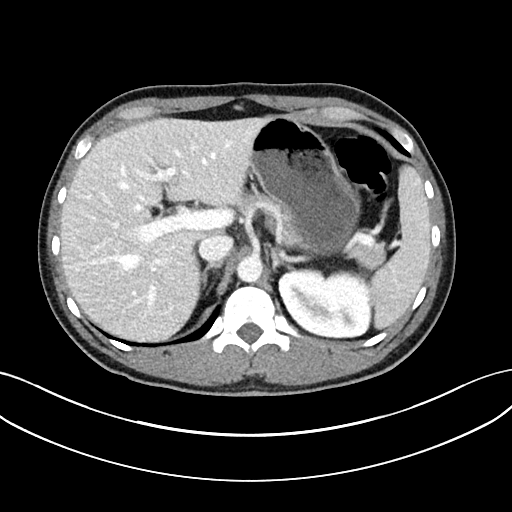
[im 81/85  soft-tissue]
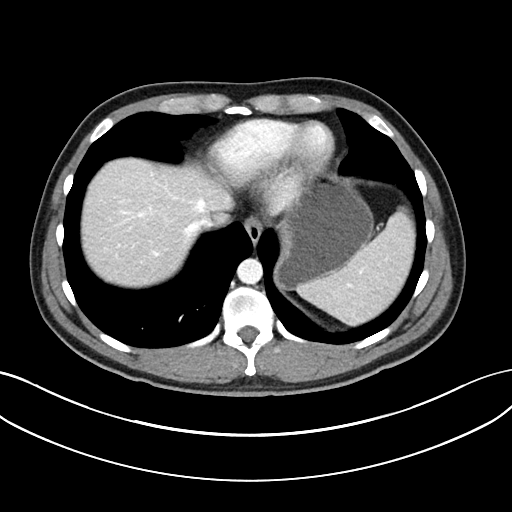

[Series 6: abdomen 3.0 mpr cor · coronal · 0.69mm/px · 3 of 82 slices shown]
[im 28/82  soft-tissue]
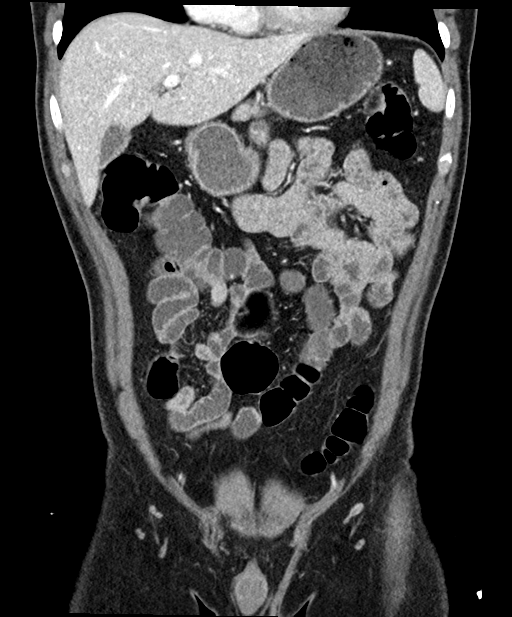
[im 37/82  soft-tissue]
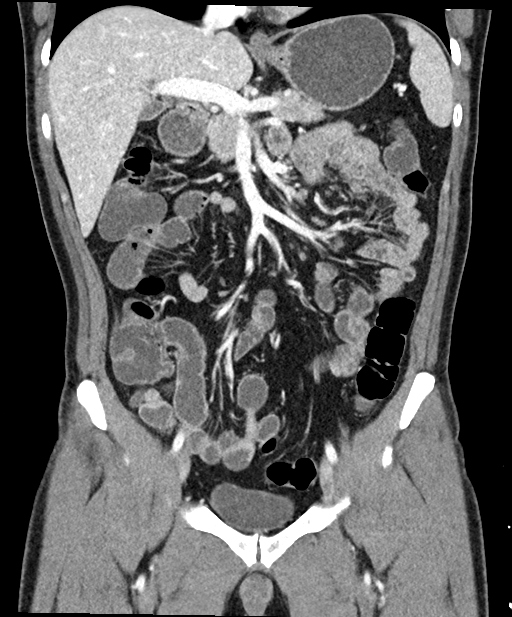
[im 46/82  soft-tissue]
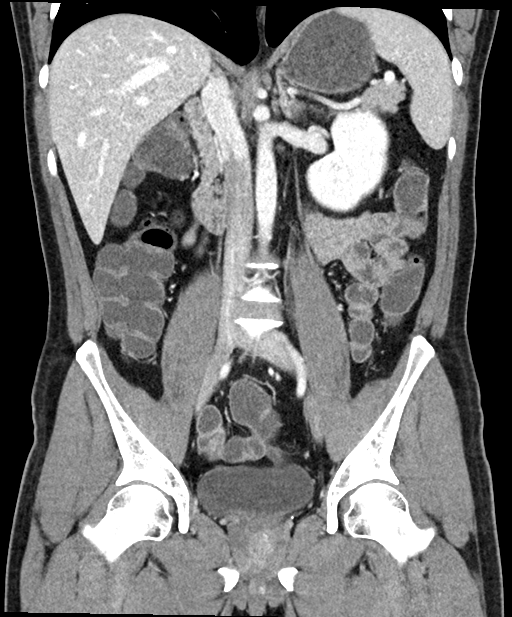

[16 of 46 positions shown; findings below may reference images not displayed]

RADIATION DOSE REDUCTION: This exam was performed according to the
departmental dose-optimization program which includes automated
exposure control, adjustment of the mA and/or kV according to
patient size and/or use of iterative reconstruction technique.

CONTRAST:  80mL OMNIPAQUE IOHEXOL 300 MG/ML  SOLN
FINDINGS: Lower chest: No acute abnormality.

Hepatobiliary: No focal liver abnormality is seen. No gallstones,
gallbladder wall thickening, or biliary dilatation.

Pancreas: Unremarkable. No pancreatic ductal dilatation or
surrounding inflammatory changes.

Spleen: Normal in size without focal abnormality.

Adrenals/Urinary Tract: Adrenal glands are within normal limits.
There is a solitary left kidney which appears within normal limits.
The bladder is normal.

Stomach/Bowel: Appendix is fluid-filled and dilated measuring up to
9 mm. Appendicoliths are present. There is no surrounding
inflammation. No evidence for bowel obstruction or free air. No
focal wall thickening or surrounding inflammation. There are
air-fluid levels throughout the colon. Stomach is within normal
limits.

Vascular/Lymphatic: No significant vascular findings are present. No
enlarged abdominal or pelvic lymph nodes.

Reproductive: Prostate is unremarkable.

Other: No abdominal wall hernia or abnormality. No abdominopelvic
ascites.

Musculoskeletal: No acute or significant osseous findings.
IMPRESSION: 1. The appendix is mildly dilated measuring 9 mm. Appendicoliths are
present. There is no surrounding inflammation. Please correlate
clinically for early acute appendicitis.
2. Air-fluid levels throughout the colon can be seen in the setting
of diarrhea.
3. Solitary left kidney.
